# Patient Record
Sex: Male | Born: 2000
Health system: Southern US, Community
[De-identification: ages and names within clinical notes are randomized; demographics above are authoritative.]

## PROBLEM LIST (undated history)

## (undated) DIAGNOSIS — Z91018 Allergy to other foods: Secondary | ICD-10-CM

## (undated) DIAGNOSIS — H1010 Acute atopic conjunctivitis, unspecified eye: Secondary | ICD-10-CM

## (undated) DIAGNOSIS — J309 Allergic rhinitis, unspecified: Secondary | ICD-10-CM

## (undated) DIAGNOSIS — J45909 Unspecified asthma, uncomplicated: Secondary | ICD-10-CM

## (undated) HISTORY — DX: Acute atopic conjunctivitis, unspecified eye: H10.10

## (undated) HISTORY — DX: Allergy to other foods: Z91.018

## (undated) HISTORY — DX: Allergic rhinitis, unspecified: J30.9

## (undated) HISTORY — PX: TONSILECTOMY, ADENOIDECTOMY, BILATERAL MYRINGOTOMY AND TUBES: SHX2538

## (undated) HISTORY — DX: Unspecified asthma, uncomplicated: J45.909

## (undated) HISTORY — PX: WISDOM TOOTH EXTRACTION: SHX21

---
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
## 2018-06-05 ENCOUNTER — Ambulatory Visit (INDEPENDENT_AMBULATORY_CARE_PROVIDER_SITE_OTHER): Payer: 59 | Admitting: Family Medicine

## 2018-06-05 ENCOUNTER — Other Ambulatory Visit: Payer: Self-pay

## 2018-06-05 DIAGNOSIS — J309 Allergic rhinitis, unspecified: Secondary | ICD-10-CM

## 2018-06-05 DIAGNOSIS — J4530 Mild persistent asthma, uncomplicated: Secondary | ICD-10-CM | POA: Diagnosis not present

## 2018-06-05 DIAGNOSIS — H1010 Acute atopic conjunctivitis, unspecified eye: Secondary | ICD-10-CM

## 2018-06-05 MED ORDER — MONTELUKAST SODIUM 10 MG PO TABS: 10.0000 mg | ORAL_TABLET | Freq: Every day | ORAL | 3 refills | Status: AC

## 2018-06-05 MED ORDER — BENZONATATE 100 MG PO CAPS: 100.0000 mg | ORAL_CAPSULE | Freq: Three times a day (TID) | ORAL | 0 refills | Status: DC | PRN

## 2018-06-05 MED ORDER — DESLORATADINE 5 MG PO TABS: ORAL_TABLET | ORAL | 5 refills | Status: DC

## 2018-06-05 MED ORDER — ALBUTEROL SULFATE HFA 108 (90 BASE) MCG/ACT IN AERS
INHALATION_SPRAY | RESPIRATORY_TRACT | 1 refills | Status: AC
Start: 2018-06-05 — End: ?

## 2018-06-05 NOTE — Progress Notes (Signed)
Telephone visit  Subjective: SW:HQPRF PCP: Nicholas Sheriff, MD (Inactive) FMB:WGYK T Nicholas Zimmerman is a 18 y.o. male calls for telephone consult today. Patient provides verbal consent for consult held via phone.  Location of patient: home Location of provider: [facility] Others present for call: Nicholas Zimmerman  1. Cough  Patient reports minimally productive cough with clear sputum that onset about 1 to 2 weeks ago.  He feels that this is triggered by allergies and reports associated rhinorrhea and initially some nasal stuffiness that has been relieved by Allegra-D.  He denies any associated diarrhea, nausea, vomiting or myalgia.  No fever.  No hemoptysis.  No wheezing.  No shortness of breath or change in activity tolerance.  He has also been using Mucinex, albuterol and Nasacort.  He was previously on Clarinex but ran out of the medication.  He is awaiting a visit to the allergist in May but again this may be put off because of COVID-19 outbreak.   ROS: Per HPI  Allergies  Allergen Reactions  . Omnicef [Cefdinir]   . Peanut-Containing Drug Products    Past Medical History:  Diagnosis Date  . Allergic rhinoconjunctivitis   . Asthma   . Food allergy     Current Outpatient Medications:  .  acetaminophen (TYLENOL) 500 MG tablet, Take 500 mg by mouth every 6 (six) hours as needed., Disp: , Rfl:  .  albuterol (VENTOLIN HFA) 108 (90 Base) MCG/ACT inhaler, Use 2 puffs every four hours as needed for cough or wheeze.  May use 2 puffs 10-20 minutes prior to exercise., Disp: 2 Inhaler, Rfl: 1 .  desloratadine (CLARINEX) 5 MG tablet, Take one tablet daily for runny nose or itching., Disp: 30 tablet, Rfl: 5 .  EPINEPHrine 0.3 mg/0.3 mL IJ SOAJ injection, Use as directed for a severe allergic reaction., Disp: 4 Device, Rfl: 1 .  Fexofenadine HCl (MUCINEX ALLERGY PO), Take by mouth., Disp: , Rfl:  .  Pseudoephedrine-Guaifenesin (MUCINEX D MAX STRENGTH) [phone] MG TB12, Take 1 y mouth twice daily as needed for  congestion., Disp: 20 each, Rfl: 0 .  Triamcinolone Acetonide (NASACORT ALLERGY 24HR NA), Place into the nose., Disp: , Rfl:   Assessment/ Plan: 18 y.o. male   1. Allergic rhinoconjunctivitis Likely uncontrolled secondary to running out of his medication.  I have added his Clarinex back and also added Singulair to use at bedtime.  He has an appointment with allergy in May but I suspect this may be canceled given COVID-19 outbreak.  We discussed signs and symptoms of asthma exacerbation and they will contact me if he develops significant wheeze, at which point we can add in steroid if needed.  At this time, I do not feel that this is infectious in nature - desloratadine (CLARINEX) 5 MG tablet; Take one tablet daily for runny nose or itching.  Dispense: 30 tablet; Refill: 5 - montelukast (SINGULAIR) 10 MG tablet; Take 1 tablet (10 mg total) by mouth at bedtime.  Dispense: 30 tablet; Refill: 3  2. Mild persistent asthma without complication As above - desloratadine (CLARINEX) 5 MG tablet; Take one tablet daily for runny nose or itching.  Dispense: 30 tablet; Refill: 5 - albuterol (VENTOLIN HFA) 108 (90 Base) MCG/ACT inhaler; Use 2 puffs every four hours as needed for cough or wheeze.  May use 2 puffs 10-20 minutes prior to exercise.  Dispense: 2 Inhaler; Refill: 1 - montelukast (SINGULAIR) 10 MG tablet; Take 1 tablet (10 mg total) by mouth at bedtime.  Dispense: 30 tablet; Refill:  3   Start time: 9:18a End time: 9:27a  Total time spent on patient care (including telephone call/ virtual visit): 12 minutes  Ashly Hulen SkainsM Gottschalk, DO [facility] [phone]

## 2018-11-08 ENCOUNTER — Other Ambulatory Visit: Payer: Self-pay

## 2018-11-08 ENCOUNTER — Ambulatory Visit: Payer: 59

## 2018-11-11 ENCOUNTER — Other Ambulatory Visit: Payer: Self-pay

## 2018-11-11 ENCOUNTER — Ambulatory Visit (INDEPENDENT_AMBULATORY_CARE_PROVIDER_SITE_OTHER): Payer: 59 | Admitting: *Deleted

## 2018-11-11 DIAGNOSIS — Z23 Encounter for immunization: Secondary | ICD-10-CM

## 2019-01-03 ENCOUNTER — Other Ambulatory Visit: Payer: Self-pay

## 2019-01-03 ENCOUNTER — Ambulatory Visit: Payer: 59 | Admitting: *Deleted

## 2019-01-03 DIAGNOSIS — Z23 Encounter for immunization: Secondary | ICD-10-CM

## 2019-01-03 NOTE — Progress Notes (Signed)
Pneumonia shot given, pt tolerated well

## 2019-03-10 DIAGNOSIS — L308 Other specified dermatitis: Secondary | ICD-10-CM | POA: Diagnosis not present

## 2019-05-13 ENCOUNTER — Other Ambulatory Visit: Payer: Self-pay

## 2019-05-13 ENCOUNTER — Encounter: Payer: Self-pay | Admitting: Family Medicine

## 2019-05-13 ENCOUNTER — Ambulatory Visit: Payer: BC Managed Care – PPO | Admitting: Family Medicine

## 2019-05-13 VITALS — BP 109/66 | HR 66 | Temp 98.4°F | Resp 20 | Ht 67.0 in | Wt 211.0 lb

## 2019-05-13 DIAGNOSIS — M25512 Pain in left shoulder: Secondary | ICD-10-CM | POA: Diagnosis not present

## 2019-05-13 MED ORDER — PREDNISONE 20 MG PO TABS: ORAL_TABLET | ORAL | 0 refills | Status: DC

## 2019-05-13 NOTE — Progress Notes (Signed)
Subjective:  Patient ID: Nicholas Zimmerman, male    DOB: May 27, 2000, 19 y.o.   MRN: [id number]  Patient Care Team: Raliegh Ip, DO as PCP - General (Family Medicine)   Chief Complaint:  Shoulder Pain   HPI: Nicholas Zimmerman is a 19 y.o. male presenting on 05/13/2019 for Shoulder Pain   Pt reports left shoulder pain. Pt states he noticed this on Thursday. No known injury. States yesterday he was unable to mover his arm due to the pain. States today he is able to move the arm normally but has some residual pain in his anterior shoulder region that is minimal.   Shoulder Pain  The pain is present in the left shoulder. This is a new problem. The current episode started in the past 7 days. The problem occurs daily. The problem has been rapidly improving. The quality of the pain is described as aching. The pain is at a severity of 1/10. The pain is mild. Pertinent negatives include no fever, inability to bear weight, itching, joint locking, joint swelling, limited range of motion, numbness, stiffness or tingling. The symptoms are aggravated by activity. He has tried movement for the symptoms. The treatment provided significant relief.      Relevant past medical, surgical, family, and social history reviewed and updated as indicated.  Allergies and medications reviewed and updated. Date reviewed: Chart in Epic.   Past Medical History:  Diagnosis Date  . Allergic rhinoconjunctivitis   . Asthma   . Food allergy     Past Surgical History:  Procedure Laterality Date  . TONSILECTOMY, ADENOIDECTOMY, BILATERAL MYRINGOTOMY AND TUBES    . WISDOM TOOTH EXTRACTION      Social History   Socioeconomic History  . Marital status: Single    Spouse name: Not on file  . Number of children: Not on file  . Years of education: Not on file  . Highest education level: Not on file  Occupational History  . Not on file  Tobacco Use  . Smoking status: Passive Smoke Exposure - Never Smoker  .  Smokeless tobacco: Never Used  Substance and Sexual Activity  . Alcohol use: No    Alcohol/week: 0.0 standard drinks  . Drug use: No  . Sexual activity: Not on file  Other Topics Concern  . Not on file  Social History Narrative  . Not on file   Social Determinants of Health   Financial Resource Strain:   . Difficulty of Paying Living Expenses:   Food Insecurity:   . Worried About Programme researcher, broadcasting/film/video in the Last Year:   . Barista in the Last Year:   Transportation Needs:   . Freight forwarder (Medical):   Marland Kitchen Lack of Transportation (Non-Medical):   Physical Activity:   . Days of Exercise per Week:   . Minutes of Exercise per Session:   Stress:   . Feeling of Stress :   Social Connections:   . Frequency of Communication with Friends and Family:   . Frequency of Social Gatherings with Friends and Family:   . Attends Religious Services:   . Active Member of Clubs or Organizations:   . Attends Banker Meetings:   Marland Kitchen Marital Status:   Intimate Partner Violence:   . Fear of Current or Ex-Partner:   . Emotionally Abused:   Marland Kitchen Physically Abused:   . Sexually Abused:     Outpatient Encounter Medications as of 05/13/2019  Medication Sig  .  acetaminophen (TYLENOL) 500 MG tablet Take 500 mg by mouth every 6 (six) hours as needed.  Marland Kitchen albuterol (VENTOLIN HFA) 108 (90 Base) MCG/ACT inhaler Use 2 puffs every four hours as needed for cough or wheeze.  May use 2 puffs 10-20 minutes prior to exercise.  Marland Kitchen desloratadine (CLARINEX) 5 MG tablet Take one tablet daily for runny nose or itching.  . montelukast (SINGULAIR) 10 MG tablet Take 1 tablet (10 mg total) by mouth at bedtime.  . Triamcinolone Acetonide (NASACORT ALLERGY 24HR NA) Place into the nose.  Marland Kitchen EPINEPHrine 0.3 mg/0.3 mL IJ SOAJ injection Use as directed for a severe allergic reaction. (Patient not taking: Reported on 05/13/2019)  . predniSONE (DELTASONE) 20 MG tablet 2 po at sametime daily for 5 days  .  [DISCONTINUED] benzonatate (TESSALON PERLES) 100 MG capsule Take 1 capsule (100 mg total) by mouth 3 (three) times daily as needed.  . [DISCONTINUED] Fexofenadine HCl (MUCINEX ALLERGY PO) Take by mouth.  . [DISCONTINUED] Pseudoephedrine-Guaifenesin (MUCINEX D MAX STRENGTH) [phone] MG TB12 Take 1 y mouth twice daily as needed for congestion.   No facility-administered encounter medications on file as of 05/13/2019.    Allergies  Allergen Reactions  . Omnicef [Cefdinir]   . Peanut-Containing Drug Products     Review of Systems  Constitutional: Negative for activity change, appetite change, chills, diaphoresis, fatigue, fever and unexpected weight change.  HENT: Negative.   Eyes: Negative.   Respiratory: Negative for cough, chest tightness and shortness of breath.   Cardiovascular: Negative for chest pain, palpitations and leg swelling.  Gastrointestinal: Negative for abdominal pain, blood in stool, constipation, diarrhea, nausea and vomiting.  Endocrine: Negative.   Genitourinary: Negative for dysuria, frequency and urgency.  Musculoskeletal: Positive for arthralgias and myalgias. Negative for back pain, gait problem, joint swelling, neck pain, neck stiffness and stiffness.  Skin: Negative.  Negative for itching.  Allergic/Immunologic: Negative.   Neurological: Negative for dizziness, tingling, tremors, seizures, syncope, facial asymmetry, speech difficulty, weakness, light-headedness, numbness and headaches.  Hematological: Negative.   Psychiatric/Behavioral: Negative for confusion, hallucinations, sleep disturbance and suicidal ideas.  All other systems reviewed and are negative.       Objective:  BP 109/66   Pulse 66   Temp 98.4 F (36.9 C)   Resp 20   Ht 5\' 7"  (1.702 m)   Wt 211 lb (95.7 kg)   SpO2 99%   BMI 33.05 kg/m    Wt Readings from Last 3 Encounters:  05/13/19 211 lb (95.7 kg)  03/26/17 254 lb (115.2 kg) (>99 %, Z= 2.48)*  03/23/17 254 lb (115.2 kg) (>99 %, Z=  2.48)*   * Growth percentiles are based on CDC (Boys, 2-20 Years) data.    Physical Exam Vitals and nursing note reviewed.  Constitutional:      General: He is not in acute distress.    Appearance: Normal appearance. He is well-developed and well-groomed. He is not ill-appearing, toxic-appearing or diaphoretic.  HENT:     Head: Normocephalic and atraumatic.     Jaw: There is normal jaw occlusion.     Right Ear: Hearing normal.     Left Ear: Hearing normal.     Nose: Nose normal.     Mouth/Throat:     Lips: Pink.     Mouth: Mucous membranes are moist.     Pharynx: Oropharynx is clear. Uvula midline.  Eyes:     General: Lids are normal.     Extraocular Movements: Extraocular movements intact.  Conjunctiva/sclera: Conjunctivae normal.     Pupils: Pupils are equal, round, and reactive to light.  Neck:     Thyroid: No thyroid mass, thyromegaly or thyroid tenderness.     Vascular: No carotid bruit or JVD.     Trachea: Trachea and phonation normal.  Cardiovascular:     Rate and Rhythm: Normal rate and regular rhythm.     Chest Wall: PMI is not displaced.     Pulses: Normal pulses.     Heart sounds: Normal heart sounds. No murmur. No friction rub. No gallop.   Pulmonary:     Effort: Pulmonary effort is normal. No respiratory distress.     Breath sounds: Normal breath sounds. No wheezing.  Abdominal:     General: Bowel sounds are normal. There is no distension or abdominal bruit.     Palpations: Abdomen is soft. There is no hepatomegaly or splenomegaly.     Tenderness: There is no abdominal tenderness. There is no right CVA tenderness or left CVA tenderness.     Hernia: No hernia is present.  Musculoskeletal:        General: Normal range of motion.     Left shoulder: Normal.     Left elbow: Normal.     Cervical back: Normal, normal range of motion and neck supple.     Right lower leg: No edema.     Left lower leg: No edema.  Lymphadenopathy:     Cervical: No cervical  adenopathy.  Skin:    General: Skin is warm and dry.     Capillary Refill: Capillary refill takes less than 2 seconds.     Coloration: Skin is not cyanotic, jaundiced or pale.     Findings: No rash.  Neurological:     General: No focal deficit present.     Mental Status: He is alert and oriented to person, place, and time.     Cranial Nerves: Cranial nerves are intact.     Sensory: Sensation is intact.     Motor: Motor function is intact.     Coordination: Coordination is intact.     Gait: Gait is intact.     Deep Tendon Reflexes: Reflexes are normal and symmetric.  Psychiatric:        Attention and Perception: Attention and perception normal.        Mood and Affect: Mood and affect normal.        Speech: Speech normal.        Behavior: Behavior normal. Behavior is cooperative.        Thought Content: Thought content normal.        Cognition and Memory: Cognition and memory normal.        Judgment: Judgment normal.     Results for orders placed or performed in visit on 03/26/17  Rapid Strep Screen (Not at [facility])   Specimen: Other   OTHER  Result Value Ref Range   Strep Gp A Ag, IA W/Reflex Negative Negative  Upper Respiratory Culture, Routine   Specimen: Other   OTHER  Result Value Ref Range   Upper Respiratory Culture Final report    Result 1 Routine flora   Culture, Group A Strep   OTHER  Result Value Ref Range   Strep A Culture CANCELED        Pertinent labs & imaging results that were available during my care of the patient were reviewed by me and considered in my medical decision making.  Assessment & Plan:  Nicholas Zimmerman  was seen today for shoulder pain.  Diagnoses and all orders for this visit:  Acute pain of left shoulder Almost resolved left shoulder pain. No known injury. Will burst with steroids. Pt aware to report any new, worsening, or persistent symptoms.  -     predniSONE (DELTASONE) 20 MG tablet; 2 po at sametime daily for 5 days     Continue all other  maintenance medications.  Follow up plan: Return if symptoms worsen or fail to improve.  Continue healthy lifestyle choices, including diet (rich in fruits, vegetables, and lean proteins, and low in salt and simple carbohydrates) and exercise (at least 30 minutes of moderate physical activity daily).  Educational handout given for shoulder pain  The above assessment and management plan was discussed with the patient. The patient verbalized understanding of and has agreed to the management plan. Patient is aware to call the clinic if they develop any new symptoms or if symptoms persist or worsen. Patient is aware when to return to the clinic for a follow-up visit. Patient educated on when it is appropriate to go to the emergency department.   Kari Baars, FNP-C [facility] [phone]

## 2019-05-13 NOTE — Patient Instructions (Signed)
Shoulder Pain Many things can cause shoulder pain, including:  An injury.  Moving the shoulder in the same way again and again (overuse).  Joint pain (arthritis). Pain can come from:  Swelling and irritation (inflammation) of any part of the shoulder.  An injury to the shoulder joint.  An injury to: ? Tissues that connect muscle to bone (tendons). ? Tissues that connect bones to each other (ligaments). ? Bones. Follow these instructions at home: Watch for changes in your symptoms. Let your doctor know about them. Follow these instructions to help with your pain. If you have a sling:  Wear the sling as told by your doctor. Remove it only as told by your doctor.  Loosen the sling if your fingers: ? Tingle. ? Become numb. ? Turn cold and blue.  Keep the sling clean.  If the sling is not waterproof: ? Do not let it get wet. ? Take the sling off when you shower or bathe. Managing pain, stiffness, and swelling   If told, put ice on the painful area: ? Put ice in a plastic bag. ? Place a towel between your skin and the bag. ? Leave the ice on for 20 minutes, 2-3 times a day. Stop putting ice on if it does not help with the pain.  Squeeze a soft ball or a foam pad as much as possible. This prevents swelling in the shoulder. It also helps to strengthen the arm. General instructions  Take over-the-counter and prescription medicines only as told by your doctor.  Keep all follow-up visits as told by your doctor. This is important. Contact a doctor if:  Your pain gets worse.  Medicine does not help your pain.  You have new pain in your arm, hand, or fingers. Get help right away if:  Your arm, hand, or fingers: ? Tingle. ? Are numb. ? Are swollen. ? Are painful. ? Turn white or blue. Summary  Shoulder pain can be caused by many things. These include injury, moving the shoulder in the same away again and again, and joint pain.  Watch for changes in your symptoms.  Let your doctor know about them.  This condition may be treated with a sling, ice, and pain medicine.  Contact your doctor if the pain gets worse or you have new pain. Get help right away if your arm, hand, or fingers tingle or get numb, swollen, or painful.  Keep all follow-up visits as told by your doctor. This is important. This information is not intended to replace advice given to you by your health care provider. Make sure you discuss any questions you have with your health care provider. Document Revised: 08/28/2017 Document Reviewed: 08/28/2017 Elsevier Patient Education  2020 Elsevier Inc[employer]  

## 2019-05-20 ENCOUNTER — Ambulatory Visit: Payer: 59 | Admitting: Orthopaedic Surgery

## 2019-07-10 ENCOUNTER — Other Ambulatory Visit: Payer: Self-pay | Admitting: Family Medicine

## 2019-07-10 DIAGNOSIS — J4530 Mild persistent asthma, uncomplicated: Secondary | ICD-10-CM

## 2019-07-10 DIAGNOSIS — J309 Allergic rhinitis, unspecified: Secondary | ICD-10-CM

## 2019-07-23 DIAGNOSIS — J3081 Allergic rhinitis due to animal (cat) (dog) hair and dander: Secondary | ICD-10-CM | POA: Diagnosis not present

## 2019-07-23 DIAGNOSIS — J3089 Other allergic rhinitis: Secondary | ICD-10-CM | POA: Diagnosis not present

## 2019-07-23 DIAGNOSIS — J301 Allergic rhinitis due to pollen: Secondary | ICD-10-CM | POA: Diagnosis not present

## 2019-07-23 DIAGNOSIS — H1045 Other chronic allergic conjunctivitis: Secondary | ICD-10-CM | POA: Diagnosis not present

## 2019-12-02 ENCOUNTER — Other Ambulatory Visit: Payer: Self-pay

## 2019-12-02 ENCOUNTER — Ambulatory Visit (INDEPENDENT_AMBULATORY_CARE_PROVIDER_SITE_OTHER): Payer: Managed Care, Other (non HMO) | Admitting: *Deleted

## 2019-12-02 DIAGNOSIS — Z23 Encounter for immunization: Secondary | ICD-10-CM | POA: Diagnosis not present

## 2020-07-22 DIAGNOSIS — H1045 Other chronic allergic conjunctivitis: Secondary | ICD-10-CM | POA: Diagnosis not present

## 2020-07-22 DIAGNOSIS — J301 Allergic rhinitis due to pollen: Secondary | ICD-10-CM | POA: Diagnosis not present

## 2020-07-22 DIAGNOSIS — J3081 Allergic rhinitis due to animal (cat) (dog) hair and dander: Secondary | ICD-10-CM | POA: Diagnosis not present

## 2020-07-22 DIAGNOSIS — J3089 Other allergic rhinitis: Secondary | ICD-10-CM | POA: Diagnosis not present

## 2020-08-04 ENCOUNTER — Ambulatory Visit (INDEPENDENT_AMBULATORY_CARE_PROVIDER_SITE_OTHER): Payer: BC Managed Care – PPO | Admitting: Nurse Practitioner

## 2020-08-04 ENCOUNTER — Encounter: Payer: Self-pay | Admitting: Nurse Practitioner

## 2020-08-04 VITALS — Temp 99.8°F

## 2020-08-04 DIAGNOSIS — J029 Acute pharyngitis, unspecified: Secondary | ICD-10-CM | POA: Insufficient documentation

## 2020-08-04 LAB — CULTURE, GROUP A STREP

## 2020-08-04 LAB — RAPID STREP SCREEN (MED CTR MEBANE ONLY): Strep Gp A Ag, IA W/Reflex: NEGATIVE

## 2020-08-04 MED ORDER — DOXYCYCLINE HYCLATE 100 MG PO TABS: 100.0000 mg | ORAL_TABLET | Freq: Two times a day (BID) | ORAL | 0 refills | Status: DC

## 2020-08-04 NOTE — Progress Notes (Signed)
   Virtual Visit  Note Due to COVID-19 pandemic this visit was conducted virtually. This visit type was conducted due to national recommendations for restrictions regarding the COVID-19 Pandemic (e.g. social distancing, sheltering in place) in an effort to limit this patient's exposure and mitigate transmission in our community. All issues noted in this document were discussed and addressed.  A physical exam was not performed with this format.  I connected with Nicholas Zimmerman on 08/04/20 at 9:17 Am  by telephone and verified that I am speaking with the correct person using two identifiers. Nicholas Zimmerman is currently located at Home during visit. The provider, Daryll Drown, NP is located in their office at time of visit.  I discussed the limitations, risks, security and privacy concerns of performing an evaluation and management service by telephone and the availability of in person appointments. I also discussed with the patient that there may be a patient responsible charge related to this service. The patient expressed understanding and agreed to proceed.   History and Present Illness:  Sore Throat  This is a new problem. The current episode started yesterday. The problem has been gradually worsening. Neither side of throat is experiencing more pain than the other. Maximum temperature: 99.8. The pain is moderate. Associated symptoms include headaches and trouble swallowing. Pertinent negatives include no coughing. He has tried nothing for the symptoms. The treatment provided no relief.      Review of Systems  HENT: Positive for sore throat and trouble swallowing.   Respiratory: Negative for cough.   Neurological: Positive for headaches.     Observations/Objective: Televisit patient did not sound to be in distress.  Assessment and Plan:  Sorethroat Uncontrolled symptoms of sore throat, head congestion, low-grade fever and difficulty swallowing. COVID-19/strep swab completed  results pending.  Advised patient to take medication as prescribed, increase hydration, Tylenol or ibuprofen for pain and fever, follow-up with worsening or unresolved symptoms.  Rx sent to pharmacy.  Follow Up Instructions:  Follow-up with worsening or unresolved symptoms   I discussed the assessment and treatment plan with the patient. The patient was provided an opportunity to ask questions and all were answered. The patient agreed with the plan and demonstrated an understanding of the instructions.   The patient was advised to call back or seek an in-person evaluation if the symptoms worsen or if the condition fails to improve as anticipated.  The above assessment and management plan was discussed with the patient. The patient verbalized understanding of and has agreed to the management plan. Patient is aware to call the clinic if symptoms persist or worsen. Patient is aware when to return to the clinic for a follow-up visit. Patient educated on when it is appropriate to go to the emergency department.   Time call ended: 9:24 AM  I provided 7 minutes of  non face-to-face time during this encounter.    Daryll Drown, NP

## 2020-08-04 NOTE — Assessment & Plan Note (Addendum)
Uncontrolled symptoms of sore throat, head congestion, low-grade fever and difficulty swallowing. COVID-19/strep swab completed results pending.  Advised patient to take medication as prescribed, increase hydration, Tylenol or ibuprofen for pain and fever, follow-up with worsening or unresolved symptoms.  Rx sent to pharmacy.

## 2020-08-05 LAB — NOVEL CORONAVIRUS, NAA: SARS-CoV-2, NAA: DETECTED — AB

## 2020-08-05 LAB — SARS-COV-2, NAA 2 DAY TAT

## 2020-08-05 NOTE — Progress Notes (Signed)
Pt aware.

## 2020-11-23 ENCOUNTER — Other Ambulatory Visit: Payer: Self-pay

## 2020-11-23 ENCOUNTER — Ambulatory Visit (INDEPENDENT_AMBULATORY_CARE_PROVIDER_SITE_OTHER): Payer: BC Managed Care – PPO

## 2020-11-23 DIAGNOSIS — Z23 Encounter for immunization: Secondary | ICD-10-CM | POA: Diagnosis not present

## 2020-11-24 ENCOUNTER — Encounter: Payer: Self-pay | Admitting: Family Medicine

## 2020-11-24 ENCOUNTER — Ambulatory Visit: Payer: BC Managed Care – PPO | Admitting: Family Medicine

## 2020-11-24 DIAGNOSIS — R0981 Nasal congestion: Secondary | ICD-10-CM

## 2020-11-24 DIAGNOSIS — J029 Acute pharyngitis, unspecified: Secondary | ICD-10-CM

## 2020-11-24 LAB — VERITOR FLU A/B WAIVED
Influenza A: NEGATIVE
Influenza B: NEGATIVE

## 2020-11-24 NOTE — Progress Notes (Signed)
   Virtual Visit  Note Due to COVID-19 pandemic this visit was conducted virtually. This visit type was conducted due to national recommendations for restrictions regarding the COVID-19 Pandemic (e.g. social distancing, sheltering in place) in an effort to limit this patient's exposure and mitigate transmission in our community. All issues noted in this document were discussed and addressed.  A physical exam was not performed with this format.  I connected with Nicholas Zimmerman on 11/24/20 at 1309 by telephone and verified that I am speaking with the correct person using two identifiers. Nicholas Zimmerman is currently located at home and his mother is currently with him during the visit. The provider, Gabriel Earing, FNP is located in their office at time of visit.  I discussed the limitations, risks, security and privacy concerns of performing an evaluation and management service by telephone and the availability of in person appointments. I also discussed with the patient that there may be a patient responsible charge related to this service. The patient expressed understanding and agreed to proceed.  CC: sore throat  History and Present Illness:  HPI Nicholas Zimmerman reports a sore throat that started last night. He also reports congestion this morning. He denies fever, chills, cough, body aches, HA, nausea, vomiting, diarrhea, chest pain, or shortness of breath. He has not looked at the back of his throat. He takes singular and nasal spray for his allergies. He has also been taking tylenol.    ROS As per HPI.   Observations/Objective: Alert and oriented x 3. Able to speak in full sentences without difficulty.    Assessment and Plan: [name] was seen today for sore throat.  Diagnoses and all orders for this visit:  Sore throat Nasal congestion Labs pending, will notify patient of results. Quarantine until [employer]. Discussed symptomatic care.  -     Veritor Flu A/B Waived -     Novel  Coronavirus, NAA ([facility])    Follow Up Instructions: Return to office for new or worsening symptoms, or if symptoms persist.     I discussed the assessment and treatment plan with the patient. The patient was provided an opportunity to ask questions and all were answered. The patient agreed with the plan and demonstrated an understanding of the instructions.   The patient was advised to call back or seek an in-person evaluation if the symptoms worsen or if the condition fails to improve as anticipated.  The above assessment and management plan was discussed with the patient. The patient verbalized understanding of and has agreed to the management plan. Patient is aware to call the clinic if symptoms persist or worsen. Patient is aware when to return to the clinic for a follow-up visit. Patient educated on when it is appropriate to go to the emergency department.   Time call ended:  1320  I provided 11 minutes of  non face-to-face time during this encounter.    Gabriel Earing, FNP

## 2020-11-25 LAB — NOVEL CORONAVIRUS, NAA: SARS-CoV-2, NAA: NOT DETECTED

## 2020-11-25 LAB — SARS-COV-2, NAA 2 DAY TAT

## 2021-02-03 ENCOUNTER — Ambulatory Visit (INDEPENDENT_AMBULATORY_CARE_PROVIDER_SITE_OTHER): Payer: BC Managed Care – PPO | Admitting: Family Medicine

## 2021-02-03 ENCOUNTER — Encounter: Payer: Self-pay | Admitting: Family Medicine

## 2021-02-03 VITALS — BP 118/77 | HR 100 | Temp 98.3°F | Ht 67.0 in | Wt 262.4 lb

## 2021-02-03 DIAGNOSIS — J0110 Acute frontal sinusitis, unspecified: Secondary | ICD-10-CM

## 2021-02-03 MED ORDER — CHLORPHEN-PE-ACETAMINOPHEN 4-10-325 MG PO TABS: 1.0000 | ORAL_TABLET | Freq: Four times a day (QID) | ORAL | 0 refills | Status: AC | PRN

## 2021-02-03 MED ORDER — AMOXICILLIN-POT CLAVULANATE 875-125 MG PO TABS: 1.0000 | ORAL_TABLET | Freq: Two times a day (BID) | ORAL | 0 refills | Status: AC

## 2021-02-03 NOTE — Progress Notes (Signed)
Acute Office Visit  Subjective:    Patient ID: Nicholas Zimmerman, male    DOB: September 20, 2000, 20 y.o.   MRN: [id number]  Chief Complaint  Patient presents with   Nasal Congestion   Facial Pain    HPI Patient is in today for cough, congestion for about 1 week. He also report frontal headache with pressure that started today. He has been taking tylenol, mucinex, and nasal spray. He take clarinex and singular daily. He denies shortness of breath, chest pain, fever, chills, nausea, vomiting, or diarrhea.   Past Medical History:  Diagnosis Date   Allergic rhinoconjunctivitis    Asthma    Food allergy     Past Surgical History:  Procedure Laterality Date   TONSILECTOMY, ADENOIDECTOMY, BILATERAL MYRINGOTOMY AND TUBES     WISDOM TOOTH EXTRACTION      Family History  Problem Relation Age of Onset   Bone cancer Maternal Grandfather     Social History   Socioeconomic History   Marital status: Single    Spouse name: Not on file   Number of children: Not on file   Years of education: Not on file   Highest education level: Not on file  Occupational History   Not on file  Tobacco Use   Smoking status: Passive Smoke Exposure - Never Smoker   Smokeless tobacco: Never  Vaping Use   Vaping Use: Never used  Substance and Sexual Activity   Alcohol use: No    Alcohol/week: 0.0 standard drinks   Drug use: No   Sexual activity: Not on file  Other Topics Concern   Not on file  Social History Narrative   Not on file   Social Determinants of Health   Financial Resource Strain: Not on file  Food Insecurity: Not on file  Transportation Needs: Not on file  Physical Activity: Not on file  Stress: Not on file  Social Connections: Not on file  Intimate Partner Violence: Not on file    Outpatient Medications Prior to Visit  Medication Sig Dispense Refill   acetaminophen (TYLENOL) 500 MG tablet Take 500 mg by mouth every 6 (six) hours as needed.     albuterol (VENTOLIN HFA) 108 (90  Base) MCG/ACT inhaler Use 2 puffs every four hours as needed for cough or wheeze.  May use 2 puffs 10-20 minutes prior to exercise. 2 Inhaler 1   desloratadine (CLARINEX) 5 MG tablet TAKE 1 TABLET DAILY FOR RUNNY NOSE AND ITCHING 30 tablet 5   montelukast (SINGULAIR) 10 MG tablet Take 1 tablet (10 mg total) by mouth at bedtime. 30 tablet 3   Olopatadine HCl 0.6 % SOLN 1-2 sprays in each nostril     Triamcinolone Acetonide (NASACORT ALLERGY 24HR NA) Place into the nose.     EPINEPHrine 0.3 mg/0.3 mL IJ SOAJ injection Use as directed for a severe allergic reaction. (Patient not taking: Reported on 05/13/2019) 4 Device 1   doxycycline (VIBRA-TABS) 100 MG tablet Take 1 tablet (100 mg total) by mouth 2 (two) times daily. 14 tablet 0   predniSONE (DELTASONE) 20 MG tablet 2 po at sametime daily for 5 days 10 tablet 0   No facility-administered medications prior to visit.    Allergies  Allergen Reactions   Omnicef [Cefdinir]    Peanut-Containing Drug Products     Review of Systems As per HPI.     Objective:    Physical Exam Vitals and nursing note reviewed.  Constitutional:      General: He  is not in acute distress.    Appearance: He is not toxic-appearing or diaphoretic.  HENT:     Right Ear: Ear canal and external ear normal. A middle ear effusion is present. Tympanic membrane is not injected, perforated, erythematous, retracted or bulging.     Left Ear: Ear canal and external ear normal. A middle ear effusion is present. Tympanic membrane is not injected, perforated, erythematous, retracted or bulging.     Nose:     Right Sinus: Frontal sinus tenderness present.     Left Sinus: Frontal sinus tenderness present.     Mouth/Throat:     Mouth: Mucous membranes are moist.     Pharynx: Oropharynx is clear. No oropharyngeal exudate or posterior oropharyngeal erythema.  Eyes:     Conjunctiva/sclera: Conjunctivae normal.     Pupils: Pupils are equal, round, and reactive to light.   Cardiovascular:     Rate and Rhythm: Normal rate and regular rhythm.     Heart sounds: Normal heart sounds. No murmur heard. Pulmonary:     Effort: Pulmonary effort is normal. No respiratory distress.     Breath sounds: Normal breath sounds. No wheezing or rhonchi.  Skin:    General: Skin is warm and dry.  Neurological:     General: No focal deficit present.     Mental Status: He is alert and oriented to person, place, and time.  Psychiatric:        Mood and Affect: Mood normal.        Behavior: Behavior normal.    BP 118/77   Pulse 100   Temp 98.3 F (36.8 C) (Temporal)   Ht '5\' 7"'  (1.702 m)   Wt 262 lb 6 oz (119 kg)   SpO2 97%   BMI 41.09 kg/m  Wt Readings from Last 3 Encounters:  02/03/21 262 lb 6 oz (119 kg)  05/13/19 211 lb (95.7 kg)  03/26/17 254 lb (115.2 kg) (>99 %, Z= 2.48)*   * Growth percentiles are based on CDC (Boys, 2-20 Years) data.    Health Maintenance Due  Topic Date Due   HPV VACCINES (1 - Male 2-dose series) Never done   HIV Screening  Never done   Hepatitis C Screening  Never done   COVID-19 Vaccine (2 - Pfizer series) 08/13/2019   TETANUS/TDAP  08/28/2019       Topic Date Due   HPV VACCINES (1 - Male 2-dose series) Never done     No results found for: TSH No results found for: WBC, HGB, HCT, MCV, PLT No results found for: NA, K, CHLORIDE, CO2, GLUCOSE, BUN, CREATININE, BILITOT, ALKPHOS, AST, ALT, PROT, ALBUMIN, CALCIUM, ANIONGAP, EGFR, GFR No results found for: CHOL No results found for: HDL No results found for: LDLCALC No results found for: TRIG No results found for: CHOLHDL No results found for: HGBA1C     Assessment & Plan:   Nicholas Zimmerman was seen today for nasal congestion and facial pain.  Diagnoses and all orders for this visit:  Acute non-recurrent frontal sinusitis Augmentin as below. Norel AD ordered, do no take tylenol with Norel AD. Continue allergy medications and nasal saline. Return to office for new or worsening  symptoms, or if symptoms persist.  -     amoxicillin-clavulanate (AUGMENTIN) 875-125 MG tablet; Take 1 tablet by mouth 2 (two) times daily for 7 days. -     Chlorphen-PE-Acetaminophen 4-10-325 MG TABS; Take 1 tablet by mouth every 6 (six) hours as needed.  The patient indicates  understanding of these issues and agrees with the plan.  Gwenlyn Perking, FNP

## 2021-06-03 DIAGNOSIS — J45901 Unspecified asthma with (acute) exacerbation: Secondary | ICD-10-CM | POA: Diagnosis not present

## 2021-07-22 DIAGNOSIS — H1045 Other chronic allergic conjunctivitis: Secondary | ICD-10-CM | POA: Diagnosis not present

## 2021-07-22 DIAGNOSIS — J4530 Mild persistent asthma, uncomplicated: Secondary | ICD-10-CM | POA: Diagnosis not present

## 2021-07-22 DIAGNOSIS — J3081 Allergic rhinitis due to animal (cat) (dog) hair and dander: Secondary | ICD-10-CM | POA: Diagnosis not present

## 2021-07-22 DIAGNOSIS — J3089 Other allergic rhinitis: Secondary | ICD-10-CM | POA: Diagnosis not present

## 2021-07-22 DIAGNOSIS — J301 Allergic rhinitis due to pollen: Secondary | ICD-10-CM | POA: Diagnosis not present

## 2021-12-23 ENCOUNTER — Ambulatory Visit (INDEPENDENT_AMBULATORY_CARE_PROVIDER_SITE_OTHER): Payer: BC Managed Care – PPO

## 2021-12-23 DIAGNOSIS — Z23 Encounter for immunization: Secondary | ICD-10-CM

## 2022-01-04 ENCOUNTER — Ambulatory Visit: Payer: BC Managed Care – PPO | Admitting: Family Medicine

## 2022-01-04 ENCOUNTER — Encounter: Payer: Self-pay | Admitting: Family Medicine

## 2022-01-04 VITALS — BP 146/91 | HR 95 | Temp 99.1°F | Ht 67.0 in | Wt 275.6 lb

## 2022-01-04 DIAGNOSIS — R059 Cough, unspecified: Secondary | ICD-10-CM | POA: Diagnosis not present

## 2022-01-04 DIAGNOSIS — J329 Chronic sinusitis, unspecified: Secondary | ICD-10-CM | POA: Diagnosis not present

## 2022-01-04 DIAGNOSIS — R0981 Nasal congestion: Secondary | ICD-10-CM | POA: Diagnosis not present

## 2022-01-04 LAB — VERITOR FLU A/B WAIVED
Influenza A: NEGATIVE
Influenza B: NEGATIVE

## 2022-01-04 LAB — RSV AG, IMMUNOCHR, WAIVED: RSV Ag, Immunochr, Waived: NEGATIVE

## 2022-01-04 MED ORDER — AMOXICILLIN-POT CLAVULANATE 875-125 MG PO TABS: 1.0000 | ORAL_TABLET | Freq: Two times a day (BID) | ORAL | 0 refills | Status: AC

## 2022-01-04 NOTE — Progress Notes (Signed)
Subjective: CC:URI PCP: Raliegh Ip, DO Nicholas Zimmerman is a 21 y.o. male presenting to clinic today for:  1.  URI Patient reports that he had onset of sinus congestion, facial pressure in the frontal and maxillary sinuses and now a yellow-greenish mucus being produced.  This started about 1 week ago and has really not changed at all since onset.  Denies any worsening.  He is compliant with his Clarinex, Singulair, Nasacort and has added Mucinex and nasal saline spray but symptoms really have not improved.  Denies any associated myalgia, shortness of breath, wheezes, fevers.  No known sick contacts.  He has not had any nausea, vomiting or diarrhea.   ROS: Per HPI  Allergies  Allergen Reactions   Omnicef [Cefdinir]    Peanut-Containing Drug Products    Past Medical History:  Diagnosis Date   Allergic rhinoconjunctivitis    Asthma    Food allergy     Current Outpatient Medications:    acetaminophen (TYLENOL) 500 MG tablet, Take 500 mg by mouth every 6 (six) hours as needed., Disp: , Rfl:    albuterol (VENTOLIN HFA) 108 (90 Base) MCG/ACT inhaler, Use 2 puffs every four hours as needed for cough or wheeze.  May use 2 puffs 10-20 minutes prior to exercise., Disp: 2 Inhaler, Rfl: 1   Chlorphen-PE-Acetaminophen 4-10-325 MG TABS, Take 1 tablet by mouth every 6 (six) hours as needed., Disp: 20 tablet, Rfl: 0   desloratadine (CLARINEX) 5 MG tablet, TAKE 1 TABLET DAILY FOR RUNNY NOSE AND ITCHING, Disp: 30 tablet, Rfl: 5   montelukast (SINGULAIR) 10 MG tablet, Take 1 tablet (10 mg total) by mouth at bedtime., Disp: 30 tablet, Rfl: 3   Olopatadine HCl 0.6 % SOLN, 1-2 sprays in each nostril, Disp: , Rfl:    Triamcinolone Acetonide (NASACORT ALLERGY 24HR NA), Place into the nose., Disp: , Rfl:    EPINEPHrine 0.3 mg/0.3 mL IJ SOAJ injection, Use as directed for a severe allergic reaction. (Patient not taking: Reported on 05/13/2019), Disp: 4 Device, Rfl: 1 Social History    Socioeconomic History   Marital status: Single    Spouse name: Not on file   Number of children: Not on file   Years of education: Not on file   Highest education level: Not on file  Occupational History   Not on file  Tobacco Use   Smoking status: Passive Smoke Exposure - Never Smoker   Smokeless tobacco: Never  Vaping Use   Vaping Use: Never used  Substance and Sexual Activity   Alcohol use: No    Alcohol/week: 0.0 standard drinks of alcohol   Drug use: No   Sexual activity: Not on file  Other Topics Concern   Not on file  Social History Narrative   Not on file   Social Determinants of Health   Financial Resource Strain: Not on file  Food Insecurity: Not on file  Transportation Needs: Not on file  Physical Activity: Not on file  Stress: Not on file  Social Connections: Not on file  Intimate Partner Violence: Not on file   Family History  Problem Relation Age of Onset   Bone cancer Maternal Grandfather     Objective: Office vital signs reviewed. BP (!) 148/93   Pulse 95   Temp 99.1 F (37.3 C)   Ht 5\' 7"  (1.702 m)   Wt 275 lb 9.6 oz (125 kg)   SpO2 95%   BMI 43.17 kg/m   Physical Examination:  General: Awake,  alert, morbidly obese, No acute distress HEENT: Normal    Neck: No masses palpated. No lymphadenopathy    Ears: Tympanic membranes intact, normal light reflex, no erythema, no bulging    Eyes: PERRLA, extraocular membranes intact, sclera white    Nose: nasal turbinates moist and erythematous, opaque nasal discharge    Throat: moist mucus membranes, no erythema, no tonsillar exudate.  Airway is patent Cardio: regular rate and rhythm, S1S2 heard, no murmurs appreciated Pulm: clear to auscultation bilaterally, no wheezes, rhonchi or rales; normal work of breathing on room air  Assessment/ Plan: 21 y.o. male   Rhinosinusitis - Plan: Veritor Flu A/B Waived, Novel Coronavirus, NAA ([facility]), RSV Ag, Immunochr, Waived, amoxicillin-clavulanate  (AUGMENTIN) 875-125 MG tablet.  Consistent with viral illness.  I have given him a pocket prescription for Augmentin and we discussed indications for use.  For now, I will continue current care.  He understands reasons for reevaluation.  He will follow-up as needed  Orders Placed This Encounter  Procedures   Novel Coronavirus, NAA ([facility])    Order Specific Question:   Previously tested for COVID-19    Answer:   Yes    Order Specific Question:   Resident in a congregate (group) care setting    Answer:   No    Order Specific Question:   Is the patient student?    Answer:   No    Order Specific Question:   Employed in healthcare setting    Answer:   No    Order Specific Question:   Has patient completed COVID vaccination(s) (2 doses of Pfizer/Moderna 1 dose of [employer])    Answer:   No   RSV Ag, Immunochr, Waived   Veritor Flu A/B Waived    Order Specific Question:   Source    Answer:   nasal   No orders of the defined types were placed in this encounter.    Raliegh Ip, DO [facility] [phone]

## 2022-01-04 NOTE — Patient Instructions (Signed)
As we discussed, I do think this is a cold virus but I am giving you a pocket prescription to have on hand should the symptoms get worse, you develop fevers or pus from your nose.  If the symptoms last longer than 2 weeks, you may also utilize this medication.  Otherwise, continue current meds and care.  Follow-up as needed  It appears that you have a viral upper respiratory infection (cold).  Cold symptoms can last up to 2 weeks.    - Get plenty of rest and drink plenty of fluids. - Try to breathe moist air. Use a cold mist humidifier. - Consume warm fluids (soup or tea) to provide relief for a stuffy nose and to loosen phlegm. - For nasal stuffiness, try saline nasal spray or a Neti Pot. Afrin nasal spray can also be used but this product should not be used longer than 3 days or it will cause rebound nasal stuffiness (worsening nasal congestion). - For sore throat pain relief: use chloraseptic spray, suck on throat lozenges, hard candy or popsicles; gargle with warm salt water (1/4 tsp. salt per 8 oz. of water); and eat soft, bland foods. - Eat a well-balanced diet. If you cannot, ensure you are getting enough nutrients by taking a daily multivitamin. - Avoid dairy products, as they can thicken phlegm. - Avoid alcohol, as it impairs your body's immune system.  CONTACT YOUR DOCTOR IF YOU EXPERIENCE ANY OF THE FOLLOWING: - High fever - Ear pain - Sinus-type headache - Unusually severe cold symptoms - Cough that gets worse while other cold symptoms improve - Flare up of any chronic lung problem, such as asthma - Your symptoms persist longer than 2 weeks

## 2022-01-05 LAB — NOVEL CORONAVIRUS, NAA: SARS-CoV-2, NAA: NOT DETECTED

## 2022-07-26 DIAGNOSIS — J3089 Other allergic rhinitis: Secondary | ICD-10-CM | POA: Diagnosis not present

## 2022-07-26 DIAGNOSIS — H1045 Other chronic allergic conjunctivitis: Secondary | ICD-10-CM | POA: Diagnosis not present

## 2022-07-26 DIAGNOSIS — J4530 Mild persistent asthma, uncomplicated: Secondary | ICD-10-CM | POA: Diagnosis not present

## 2022-07-26 DIAGNOSIS — J301 Allergic rhinitis due to pollen: Secondary | ICD-10-CM | POA: Diagnosis not present

## 2022-07-26 DIAGNOSIS — J3081 Allergic rhinitis due to animal (cat) (dog) hair and dander: Secondary | ICD-10-CM | POA: Diagnosis not present

## 2023-07-24 DIAGNOSIS — J4530 Mild persistent asthma, uncomplicated: Secondary | ICD-10-CM | POA: Diagnosis not present

## 2023-07-24 DIAGNOSIS — J3089 Other allergic rhinitis: Secondary | ICD-10-CM | POA: Diagnosis not present

## 2023-07-24 DIAGNOSIS — J301 Allergic rhinitis due to pollen: Secondary | ICD-10-CM | POA: Diagnosis not present

## 2023-07-24 DIAGNOSIS — J3081 Allergic rhinitis due to animal (cat) (dog) hair and dander: Secondary | ICD-10-CM | POA: Diagnosis not present

## 2023-12-31 ENCOUNTER — Ambulatory Visit

## 2024-02-15 ENCOUNTER — Other Ambulatory Visit: Payer: Self-pay
